# Patient Record
Sex: Male | Born: 1975 | Race: Black or African American | Hispanic: No | Marital: Single | Smoking: Never smoker
Health system: Southern US, Community
[De-identification: ages and names within clinical notes are randomized; demographics above are authoritative.]

## PROBLEM LIST (undated history)

## (undated) DIAGNOSIS — I10 Essential (primary) hypertension: Secondary | ICD-10-CM

## (undated) HISTORY — PX: KNEE ARTHROSCOPY: SUR90

---

## 2014-06-10 ENCOUNTER — Observation Stay (HOSPITAL_COMMUNITY)
Admission: AD | Admit: 2014-06-10 | Discharge: 2014-06-11 | Disposition: A | Discharge status: Home / Self Care | Payer: BC Managed Care – PPO | Source: Other Acute Inpatient Hospital | Attending: Cardiology | Admitting: Cardiology

## 2014-06-10 ENCOUNTER — Encounter (HOSPITAL_COMMUNITY): Payer: Self-pay | Admitting: Cardiology

## 2014-06-10 DIAGNOSIS — R072 Precordial pain: Principal | ICD-10-CM

## 2014-06-10 DIAGNOSIS — R079 Chest pain, unspecified: Secondary | ICD-10-CM | POA: Diagnosis present

## 2014-06-10 DIAGNOSIS — I10 Essential (primary) hypertension: Secondary | ICD-10-CM | POA: Diagnosis present

## 2014-06-10 HISTORY — DX: Essential (primary) hypertension: I10

## 2014-06-10 LAB — COMPREHENSIVE METABOLIC PANEL
ALBUMIN: 3.6 g/dL (ref 3.5–5.2)
ALT: 29 U/L (ref 0–53)
AST: 20 U/L (ref 0–37)
Alkaline Phosphatase: 59 U/L (ref 39–117)
Anion gap: 10 (ref 5–15)
BUN: 10 mg/dL (ref 6–23)
CALCIUM: 9.4 mg/dL (ref 8.4–10.5)
CO2: 33 mEq/L — ABNORMAL HIGH (ref 19–32)
Chloride: 101 mEq/L (ref 96–112)
Creatinine, Ser: 0.76 mg/dL (ref 0.50–1.35)
GFR calc Af Amer: >90 mL/min (ref >90)
GFR calc non Af Amer: >90 mL/min (ref >90)
Glucose, Bld: 82 mg/dL (ref 70–99)
Potassium: 3.2 mEq/L — ABNORMAL LOW (ref 3.7–5.3)
SODIUM: 144 meq/L (ref 137–147)
TOTAL PROTEIN: 7.3 g/dL (ref 6.0–8.3)
Total Bilirubin: 1.8 mg/dL — ABNORMAL HIGH (ref 0.3–1.2)

## 2014-06-10 LAB — PROTIME-INR
INR: 1.22 (ref 0.00–1.49)
Prothrombin Time: 15.4 seconds — ABNORMAL HIGH (ref 11.6–15.2)

## 2014-06-10 LAB — CBC WITH DIFFERENTIAL/PLATELET
BASOS ABS: 0 10*3/uL (ref 0.0–0.1)
Basophils Relative: 0 % (ref 0–1)
EOS PCT: 1 % (ref 0–5)
Eosinophils Absolute: 0.1 10*3/uL (ref 0.0–0.7)
HEMATOCRIT: 44.5 % (ref 39.0–52.0)
Hemoglobin: 15.6 g/dL (ref 13.0–17.0)
LYMPHS PCT: 25 % (ref 12–46)
Lymphs Abs: 1.9 10*3/uL (ref 0.7–4.0)
MCH: 30.6 pg (ref 26.0–34.0)
MCHC: 35.1 g/dL (ref 30.0–36.0)
MCV: 87.3 fL (ref 78.0–100.0)
Monocytes Absolute: 0.7 10*3/uL (ref 0.1–1.0)
Monocytes Relative: 10 % (ref 3–12)
Neutro Abs: 4.8 10*3/uL (ref 1.7–7.7)
Neutrophils Relative %: 64 % (ref 43–77)
PLATELETS: 258 10*3/uL (ref 150–400)
RBC: 5.1 MIL/uL (ref 4.22–5.81)
RDW: 12.2 % (ref 11.5–15.5)
WBC: 7.5 10*3/uL (ref 4.0–10.5)

## 2014-06-10 LAB — APTT: aPTT: 30 seconds (ref 24–37)

## 2014-06-10 LAB — MAGNESIUM: Magnesium: 1.8 mg/dL (ref 1.5–2.5)

## 2014-06-10 LAB — TROPONIN I: Troponin I: <0.3 ng/mL (ref <0.30)

## 2014-06-10 LAB — TSH: TSH: 0.983 u[IU]/mL (ref 0.350–4.500)

## 2014-06-10 MED ORDER — MORPHINE SULFATE 2 MG/ML IJ SOLN: 2.0000 mg | INTRAMUSCULAR | Status: DC | PRN

## 2014-06-10 MED ORDER — ASPIRIN 81 MG PO CHEW: 324.0000 mg | CHEWABLE_TABLET | ORAL | Status: AC

## 2014-06-10 MED ORDER — ONDANSETRON HCL 4 MG/2ML IJ SOLN: 4.0000 mg | Freq: Four times a day (QID) | INTRAMUSCULAR | Status: DC | PRN

## 2014-06-10 MED ORDER — NITROGLYCERIN 0.4 MG SL SUBL: 0.4000 mg | SUBLINGUAL_TABLET | SUBLINGUAL | Status: DC | PRN

## 2014-06-10 MED ORDER — ACETAMINOPHEN 325 MG PO TABS: 650.0000 mg | ORAL_TABLET | ORAL | Status: DC | PRN

## 2014-06-10 MED ORDER — HYDROCHLOROTHIAZIDE 25 MG PO TABS
25.0000 mg | ORAL_TABLET | Freq: Every day | ORAL | Status: DC
  Administered 2014-06-10 – 2014-06-11 (×2): 25 mg via ORAL
  Filled 2014-06-10 (×2): qty 1

## 2014-06-10 MED ORDER — ASPIRIN 300 MG RE SUPP: 300.0000 mg | RECTAL | Status: AC

## 2014-06-10 MED ORDER — METOPROLOL TARTRATE 25 MG PO TABS
25.0000 mg | ORAL_TABLET | Freq: Two times a day (BID) | ORAL | Status: DC
  Administered 2014-06-10 (×2): 25 mg via ORAL
  Filled 2014-06-10 (×4): qty 1

## 2014-06-10 MED ORDER — HEPARIN SODIUM (PORCINE) 5000 UNIT/ML IJ SOLN
5000.0000 [IU] | Freq: Three times a day (TID) | INTRAMUSCULAR | Status: DC
  Administered 2014-06-10 – 2014-06-11 (×3): 5000 [IU] via SUBCUTANEOUS
  Filled 2014-06-10 (×4): qty 1

## 2014-06-10 MED ORDER — IBUPROFEN 600 MG PO TABS
600.0000 mg | ORAL_TABLET | Freq: Three times a day (TID) | ORAL | Status: DC
  Administered 2014-06-10 – 2014-06-11 (×4): 600 mg via ORAL
  Filled 2014-06-10 (×6): qty 1

## 2014-06-10 MED ORDER — MORPHINE SULFATE 2 MG/ML IJ SOLN
INTRAMUSCULAR | Status: AC
  Administered 2014-06-10: 2 mg via INTRAVENOUS
  Filled 2014-06-10: qty 1

## 2014-06-10 MED ORDER — KETOROLAC TROMETHAMINE 15 MG/ML IJ SOLN
15.0000 mg | Freq: Four times a day (QID) | INTRAMUSCULAR | Status: DC | PRN
  Administered 2014-06-10: 15 mg via INTRAVENOUS
  Filled 2014-06-10: qty 1

## 2014-06-10 MED ORDER — ASPIRIN EC 81 MG PO TBEC
81.0000 mg | DELAYED_RELEASE_TABLET | Freq: Every day | ORAL | Status: DC
  Administered 2014-06-11: 81 mg via ORAL
  Filled 2014-06-10: qty 1

## 2014-06-10 NOTE — H&P (Addendum)
Admit date: 06/10/2014 Referring Physician Dr. Sharee Pimple at [facility] ER Primary Cardiologist:  NONE Chief complaint/reason for admission: Chest pain  HPI: This is a 38yo AAM with a PMH of HTN but no history of cardiac disease who presented to [facility] this am with complaints of CP.  He was in his USOH until last night while driving.  He says it felt like a pulled muscle with pressure and tightness in his upper left chest with tingling in his left arm.  He had some diaphoresis earlier in the day but denied any nausea or SOB.  At its worst it was a 6/10.  He was given NTPaste with no improvement in his pain but Morphine helped.  Of note he has been under a lot of stress over the past 4 days and on Monday night started boxing.  His pain has been constant since last night.  He denies any recent fever, chills or URI symptoms.  His symptoms are worse with movement with deep inspiration and movement.  Prior to this episode he has no DOE or exertional CP.  At [facility] ER his BNP and troponin were normal.  BP was elevated.  His EKG showed NSR with diffuse PR depression and LVH.  He is now transferred to [facility] for further evaluation.    PMH:    Past Medical History  Diagnosis Date  . Hypertension     PSH:    Past Surgical History  Procedure Laterality Date  . Knee arthroscopy      ALLERGIES:   Shrimp  Prior to Admit Meds:   Prescriptions prior to admission  Medication Sig Dispense Refill  . hydrochlorothiazide (HYDRODIURIL) 12.5 MG tablet Take 12.5 mg by mouth daily.      Marland Kitchen ibuprofen (ADVIL,MOTRIN) 200 MG tablet Take 200 mg by mouth every 6 (six) hours as needed for mild pain or moderate pain.       Family HX:    Family History  Problem Relation Age of Onset  . Hypertension Mother   . CVA Father    Social HX:    History   Social History  . Marital Status: Unknown    Spouse Name: N/A    Number of Children: N/A  . Years of Education: N/A   Occupational History  . Not on file.    Social History Main Topics  . Smoking status: Never Smoker   . Smokeless tobacco: Not on file  . Alcohol Use: No  . Drug Use: No  . Sexual Activity: Not on file   Other Topics Concern  . Not on file   Social History Narrative  . No narrative on file     ROS:  All 11 ROS were addressed and are negative except what is stated in the HPI  PHYSICAL EXAM Filed Vitals:   06/10/14 1219  BP: 156/95  Pulse: 79  Temp:   Resp:    General: Well developed, well nourished, in no acute distress Head: Eyes PERRLA, No xanthomas.   Normal cephalic and atramatic  Lungs:   Clear bilaterally to auscultation and percussion. Heart:   HRRR S1 S2 Pulses are 2+ & equal.            No carotid bruit. No JVD.  No abdominal bruits. No femoral bruits. Abdomen: Bowel sounds are positive, abdomen soft and non-tender without masses  Extremities:   No clubbing, cyanosis or edema.  DP +1 Neuro: Alert and oriented X 3. Psych:  Good affect, responds appropriately  Labs:   No results found for this basename: WBC,  HGB,  HCT,  MCV,  PLT   No results found for this basename: NA, K, CL, CO2, BUN, CREATININE, CALCIUM, LABALBU, PROT, BILITOT, ALKPHOS, ALT, AST, GLUCOSE,  in the last 168 hours No results found for this basename: CKTOTAL,  CKMB,  CKMBINDEX,  TROPONINI   No results found for this basename: PTT   No results found for this basename: INR,  PROTIME     No results found for this basename: CHOL   No results found for this basename: HDL   No results found for this basename: LDLCALC   No results found for this basename: TRIG   No results found for this basename: CHOLHDL   No results found for this basename: LDLDIRECT      Radiology:  No results found.  EKG:  NSR with PR depression, LVH  ASSESSMENT:  1.  Atypical CP with negative cardiac enzymes x 1.  One of his EKGs at [facility] showed some PR depression and diffuse minimal ST elevation which could indicate acute pericarditis.  His pain  is somewhat positional and worse with inspiration, but it also his worse with movement of his upper left arm and worse with palpation raising the question of musculoskeletal etiology especially in the setting of recently taking up boxing earlier this week.  D-Dimer and chest xray were normal. 2.  HTN poorly controlled with evidence of end organ damage with LVH on EKG  PLAN:   1.  Admit to tele bed 2.  Cycle cardiac enzymes 3.  Check 2D echo to assess for LVH and LVF 4.  No IV Heparin unless enzymes become positive 5.  NPO after MN 6.  Stress echo in am if enzymes are negative 7.  Add Lopressor  BID for BP control   Quintella Reichert, MD  06/10/2014  12:26 PM

## 2014-06-11 DIAGNOSIS — R072 Precordial pain: Principal | ICD-10-CM

## 2014-06-11 LAB — BASIC METABOLIC PANEL
Anion gap: 14 (ref 5–15)
BUN: 18 mg/dL (ref 6–23)
CALCIUM: 9.4 mg/dL (ref 8.4–10.5)
CO2: 30 mEq/L (ref 19–32)
Chloride: 96 mEq/L (ref 96–112)
Creatinine, Ser: 0.99 mg/dL (ref 0.50–1.35)
GFR calc Af Amer: >90 mL/min (ref >90)
GFR calc non Af Amer: >90 mL/min (ref >90)
GLUCOSE: 111 mg/dL — AB (ref 70–99)
Potassium: 3.4 mEq/L — ABNORMAL LOW (ref 3.7–5.3)
Sodium: 140 mEq/L (ref 137–147)

## 2014-06-11 LAB — LIPID PANEL
CHOL/HDL RATIO: 4.1 ratio
CHOLESTEROL: 155 mg/dL (ref 0–200)
HDL: 38 mg/dL — ABNORMAL LOW (ref >39)
LDL CALC: 99 mg/dL (ref 0–99)
Triglycerides: 88 mg/dL (ref <150)
VLDL: 18 mg/dL (ref 0–40)

## 2014-06-11 LAB — TROPONIN I: Troponin I: <0.3 ng/mL (ref <0.30)

## 2014-06-11 MED ORDER — KETOROLAC TROMETHAMINE 30 MG/ML IJ SOLN
15.0000 mg | Freq: Four times a day (QID) | INTRAMUSCULAR | Status: DC | PRN
  Administered 2014-06-11: 15 mg via INTRAVENOUS
  Filled 2014-06-11: qty 1

## 2014-06-11 MED ORDER — POTASSIUM CHLORIDE CRYS ER 20 MEQ PO TBCR
20.0000 meq | EXTENDED_RELEASE_TABLET | Freq: Once | ORAL | Status: AC
  Administered 2014-06-11: 20 meq via ORAL
  Filled 2014-06-11: qty 1

## 2014-06-11 MED ORDER — AMLODIPINE BESYLATE 5 MG PO TABS
5.0000 mg | ORAL_TABLET | Freq: Every day | ORAL | Status: DC
  Administered 2014-06-11: 5 mg via ORAL
  Filled 2014-06-11: qty 1

## 2014-06-11 MED ORDER — ASPIRIN 81 MG PO TBEC: 81.0000 mg | DELAYED_RELEASE_TABLET | Freq: Every day | ORAL | Status: DC

## 2014-06-11 MED ORDER — AMLODIPINE BESYLATE 5 MG PO TABS: 5.0000 mg | ORAL_TABLET | Freq: Every day | ORAL | Status: DC

## 2014-06-11 MED ORDER — ACETAMINOPHEN 325 MG PO TABS: 650.0000 mg | ORAL_TABLET | ORAL | Status: DC | PRN

## 2014-06-11 NOTE — Progress Notes (Signed)
UR completed 

## 2014-06-11 NOTE — Progress Notes (Signed)
Echocardiogram Echocardiogram Stress Test has been performed.  Francisco Crawford 06/11/2014, 3:44 PM

## 2014-06-11 NOTE — Discharge Instructions (Addendum)
Chest Pain (Nonspecific) It is often hard to give a diagnosis for the cause of chest pain. There is always a chance that your pain could be related to something serious, such as a heart attack or a blood clot in the lungs. You need to follow up with your doctor. HOME CARE  If antibiotic medicine was given, take it as directed by your doctor. Finish the medicine even if you start to feel better.  For the next few days, avoid activities that bring on chest pain. Continue physical activities as told by your doctor.  Do not use any tobacco products. This includes cigarettes, chewing tobacco, and e-cigarettes.  Avoid drinking alcohol.  Only take medicine as told by your doctor.  Follow your doctor's suggestions for more testing if your chest pain does not go away.  Keep all doctor visits you made. GET HELP IF:  Your chest pain does not go away, even after treatment.  You have a rash with blisters on your chest.  You have a fever. GET HELP RIGHT AWAY IF:   You have more pain or pain that spreads to your arm, neck, jaw, back, or belly (abdomen).  You have shortness of breath.  You cough more than usual or cough up blood.  You have very bad back or belly pain.  You feel sick to your stomach (nauseous) or throw up (vomit).  You have very bad weakness.  You pass out (faint).  You have chills. This is an emergency. Do not wait to see if the problems will go away. Call your local emergency services (911 in [place] Do not drive yourself to the hospital. MAKE SURE YOU:   Understand these instructions.  Will watch your condition.  Will get help right away if you are not doing well or get worse. Document Released: 03/20/2008 Document Revised: 10/07/2013 Document Reviewed: 03/20/2008 [facility] Patient Information 2015 [place], [place]. This information is not intended to replace advice given to you by your health care provider. Make sure you discuss any questions you have with your  health care provider. Hypertension Hypertension is another name for high blood pressure. High blood pressure forces your heart to work harder to pump blood. A blood pressure reading has two numbers, which includes a higher number over a lower number (example: 110/72). HOME CARE   Have your blood pressure rechecked by your doctor.  Only take medicine as told by your doctor. Follow the directions carefully. The medicine does not work as well if you skip doses. Skipping doses also puts you at risk for problems.  Do not smoke.  Monitor your blood pressure at home as told by your doctor. GET HELP IF:  You think you are having a reaction to the medicine you are taking.  You have repeat headaches or feel dizzy.  You have puffiness (swelling) in your ankles.  You have trouble with your vision. GET HELP RIGHT AWAY IF:   You get a very bad headache and are confused.  You feel weak, numb, or faint.  You get chest or belly (abdominal) pain.  You throw up (vomit).  You cannot breathe very well. MAKE SURE YOU:   Understand these instructions.  Will watch your condition.  Will get help right away if you are not doing well or get worse. Document Released: 03/20/2008 Document Revised: 10/07/2013 Document Reviewed: 07/25/2013 [facility] Patient Information 2015 [place], [place]. This information is not intended to replace advice given to you by your health care provider. Make sure you discuss  any questions you have with your health care provider.  Adenosine Stress Electrocardiography An adenosine stress electrocardiography is a test used to detect heart disease (coronary artery disease). Adenosine is a medicine that makes the heart arteries react as if you are exercising. Adenosine is given with a radioactive tracer. The "tracer" is a safe radioactive substance that travels in the bloodstream to the heart arteries. Special imaging cameras detect the tracer and help find blocked arteries in the  heart. This test may be done with or without treadmill exercise.  LET [facility] CARE PROVIDER KNOW ABOUT:  Allergies, including latex allergies.  All prescription medicines you taking as well as all non-prescription and over-the-counter medicines, including herbs and vitamins.  Use of steroids (by mouth or creams).  Previous problems with anesthetics or novocaine.  History of blood clots or bleeding problems.  Previous surgery.  Other health problems such as kidney or lung conditions.  Possibility of pregnancy, if this applies. RISKS AND COMPLICATIONS You may develop chest discomfort, shortness of breath, sweating, or light-headedness during the test. On rare occasions, you could experience a heart attack or your heart may go into a very fast or irregular rhythm. This could cause you to collapse. To ensure your safety, your health care provider will supervise the test. Your blood pressure and electrocardiogram are constantly watched. The test team watches for and is able to treat any problems. BEFORE THE PROCEDURE  Do not eat or drink caffeine for 12 to 24 hours before the test. This includes all caffeinated beverages and food, such as pop, coffee (roasted, instant, decaffeinated roasted, decaffeinated instant), hot chocolate, tea, and all chocolate.  Do not smoke on the day of your test. Smoking on the day of your test may change your test results.  Do not eat anything 3 hours before the test or as recommended by your health care provider. Eating may cause an unclear image and may also cause nausea. If you are diabetic, talk to your health care provider regarding your insulin coverage.  Bring a list of all the medicines you are taking. Take your medicine as usual before the test except as told by the testing center.  Wear comfortable clothing, such as a short-sleeve shirt and sweatpants. Do not wear an underwire bra or jewelry. A hospital gown can be provided.  Shower before your  appointment to reduce the spread of bacteria.  You may want to bring a book to read because there are some waiting periods during the test.  Your health care provider will go over the adenosine stress test with you, such as procedure protocol, what to expect, how long it will take, and results. PROCEDURE   An IV will be started in a vein in your hand or arm.  Electrode patches will be placed on your chest. The electrodes are connected to a monitor so your heart rhythm and heart rate can be watched. Your blood pressure will also be monitored during the test.  Two sets of images are usually taken of your heart. The images compare your heart at rest and when it is "stressed." This first image is a "resting" picture of your heart. The "resting" image is usually done before adenosine is given.  Adenosine is given in the IV over a period of 4 to 6 minutes.  After the adenosine is given, you will be monitored for a few minutes afterward to ensure your heart rate, heart rhythm, and blood pressure are normal. AFTER THE PROCEDURE  When your  test is completed, you may be asked to schedule an office visit with your health care provider to discuss the test results, or your health care provider may choose to call you with the results.  Document Released: 12/10/2006 Document Revised: 02/16/2014 Document Reviewed: 01/17/2012 [facility] Patient Information 2015 [place], [place]. This information is not intended to replace advice given to you by your health care provider. Make sure you discuss any questions you have with your health care provider.  Electrocardiography Electrocardiography is a test to check the heart. It looks at how your heart beats. It is done if:   You are having a heart attack.  You may have had a heart attack in the past.  You are having a health checkup. PROCEDURE  This test is easy and painless.  You will remove your clothes from the waist up, wear a hospital gown, and lie down on  your back.  Small round pads (electrodes) will be placed on your chest, arms, and legs. The round pads are attached to wires that go to a machine. The machine records the electrical activity of your heart.  You will be asked to relax and lie very still. AFTER THE PROCEDURE  If the test was done as part of a routine exam, you can go back to your normal activity as told by your doctor.  Your results will be looked at by a heart doctor (cardiologist).  Ask when your test results will be ready. Make sure you get your test results. Document Released: 09/14/2008 Document Revised: 02/16/2014 Document Reviewed: 02/12/2012 [facility] Patient Information 2015 [place], [place]. This information is not intended to replace advice given to you by your health care provider. Make sure you discuss any questions you have with your health care provider.

## 2014-06-11 NOTE — Progress Notes (Signed)
CXR report from [facility] 06/10/14-   " The heart size and mediastinal countors are within normal limits. Both lungs are clear. The visualized skeletal structures are unremarkable".   Impression: No active disease  Dr Burman Nieves MD   Corine Shelter PA-C 06/11/2014 11:30 AM

## 2014-06-11 NOTE — Progress Notes (Signed)
SUBJECTIVE:  Still with soreness that is most likely musculoskeletal.     PHYSICAL EXAM Filed Vitals:   06/10/14 2049 06/10/14 2100 06/10/14 2127 06/11/14 0500  BP: 142/104 126/53 144/107 140/96  Pulse: 75 61  68  Temp: 97.9 F (36.6 C) 97.7 F (36.5 C)  98.1 F (36.7 C)  TempSrc: Oral Oral  Oral  Resp: SpO2: 100% 97%  99%   General:  No distress Lungs:  Clear Heart:  RRR Abdomen:  Positive bowel sounds, no rebound no guarding Extremities:  No edema   LABS: Lab Results  Component Value Date   TROPONINI <0.30 06/11/2014   Results for orders placed during the hospital encounter of 06/10/14 (from the past 24 hour(s))  COMPREHENSIVE METABOLIC PANEL     Status: Abnormal   Collection Time    06/10/14  1:33 PM      Result Value Ref Range   Sodium 144  137 - 147 mEq/L   Potassium 3.2 (*) 3.7 - 5.3 mEq/L   Chloride 101  96 - 112 mEq/L   CO2 33 (*) 19 - 32 mEq/L   Glucose, Bld 82  70 - 99 mg/dL   BUN 10  6 - 23 mg/dL   Creatinine, Ser 6.57  0.50 - 1.35 mg/dL   Calcium 9.4  8.4 - 84.6 mg/dL   Total Protein 7.3  6.0 - 8.3 g/dL   Albumin 3.6  3.5 - 5.2 g/dL   AST 20  0 - 37 U/L   ALT 29  0 - 53 U/L   Alkaline Phosphatase 59  39 - 117 U/L   Total Bilirubin 1.8 (*) 0.3 - 1.2 mg/dL   GFR calc non Af Amer >90  >90 mL/min   GFR calc Af Amer >90  >90 mL/min   Anion gap 10  5 - 15  MAGNESIUM     Status: None   Collection Time    06/10/14  1:33 PM      Result Value Ref Range   Magnesium 1.8  1.5 - 2.5 mg/dL  TSH     Status: None   Collection Time    06/10/14  1:33 PM      Result Value Ref Range   TSH 0.983  0.350 - 4.500 uIU/mL  TROPONIN I     Status: None   Collection Time    06/10/14  1:33 PM      Result Value Ref Range   Troponin I <0.30  <0.30 ng/mL  CBC WITH DIFFERENTIAL     Status: None   Collection Time    06/10/14  1:33 PM      Result Value Ref Range   WBC 7.5  4.0 - 10.5 K/uL   RBC 5.10  4.22 - 5.81 MIL/uL   Hemoglobin 15.6  13.0 - 17.0 g/dL     HCT 96.2  95.2 - 84.1 %   MCV 87.3  78.0 - 100.0 fL   MCH 30.6  26.0 - 34.0 pg   MCHC 35.1  30.0 - 36.0 g/dL   RDW 32.4  40.1 - 02.7 %   Platelets 258  150 - 400 K/uL   Neutrophils Relative % 64  43 - 77 %   Neutro Abs 4.8  1.7 - 7.7 K/uL   Lymphocytes Relative 25  12 - 46 %   Lymphs Abs 1.9  0.7 - 4.0 K/uL   Monocytes Relative 10  3 - 12 %   Monocytes  Absolute 0.7  0.1 - 1.0 K/uL   Eosinophils Relative 1  0 - 5 %   Eosinophils Absolute 0.1  0.0 - 0.7 K/uL   Basophils Relative 0  0 - 1 %   Basophils Absolute 0.0  0.0 - 0.1 K/uL  PROTIME-INR     Status: Abnormal   Collection Time    06/10/14  1:33 PM      Result Value Ref Range   Prothrombin Time 15.4 (*) 11.6 - 15.2 seconds   INR 1.22  0.00 - 1.49  APTT     Status: None   Collection Time    06/10/14  1:33 PM      Result Value Ref Range   aPTT 30  24 - 37 seconds  TROPONIN I     Status: None   Collection Time    06/10/14  6:50 PM      Result Value Ref Range   Troponin I <0.30  <0.30 ng/mL  TROPONIN I     Status: None   Collection Time    06/11/14 12:37 AM      Result Value Ref Range   Troponin I <0.30  <0.30 ng/mL  BASIC METABOLIC PANEL     Status: Abnormal   Collection Time    06/11/14 12:37 AM      Result Value Ref Range   Sodium 140  137 - 147 mEq/L   Potassium 3.4 (*) 3.7 - 5.3 mEq/L   Chloride 96  96 - 112 mEq/L   CO2 30  19 - 32 mEq/L   Glucose, Bld 111 (*) 70 - 99 mg/dL   BUN 18  6 - 23 mg/dL   Creatinine, Ser 1.61  0.50 - 1.35 mg/dL   Calcium 9.4  8.4 - 09.6 mg/dL   GFR calc non Af Amer >90  >90 mL/min   GFR calc Af Amer >90  >90 mL/min   Anion gap 14  5 - 15  LIPID PANEL     Status: Abnormal   Collection Time    06/11/14 12:37 AM      Result Value Ref Range   Cholesterol 155  0 - 200 mg/dL   Triglycerides 88  <045 mg/dL   HDL 38 (*) >40 mg/dL   Total CHOL/HDL Ratio 4.1     VLDL 18  0 - 40 mg/dL   LDL Cholesterol 99  0 - 99 mg/dL    Intake/Output Summary (Last 24 hours) at 06/11/14 0720 Last  data filed at 06/10/14 1800  Gross per 24 hour  Intake    360 ml  Output    225 ml  Net    135 ml    EKG:   Sinus rhythm, rate 78, axis within normal limits, intervals within normal limits, no acute ST-T wave changes.  Early repol.  06/11/2014   ASSESSMENT AND PLAN:  Chest pain:  No objective evidence of ischemia.  Plan stress test today.  Home if negative.  HTN:  OK to continue beta blocker    Rollene Rotunda 06/11/2014 7:20 AM

## 2014-06-11 NOTE — Discharge Summary (Signed)
Patient ID: Francisco Crawford,  MRN: [id number], DOB/AGE: [birth year] 38 y.o.  Admit date: 06/10/2014 Discharge date: 06/11/2014  Primary Care Provider: Estanislado Pandy, MD Primary Cardiologist: Joycelyn Das pt  Discharge Diagnoses Principal Problem:   Chest pain Active Problems:   Hypertension    Procedures: Stress echo 06/11/14   Hospital Course: 38yo AAM with a PMH of HTN but no history of cardiac disease who presented to [facility] 06/10/14 with complaints of CP. At [facility] his Troponin was negative but his EKG was abnormal with LVH. He was transferred to [facility] and ruled out for an MI. He underwent a stress echo 06/11/14 which was negative though he did have elevated B/P. Norvasc was added and he has been instructed to call the [facility] office for follow up. We feel he can be discharged late on the 27th.    Discharge Vitals:  Blood pressure 140/96, pulse 68, temperature 98.1 F (36.7 C), temperature source Oral, resp. rate 18, height  (1.753 m), weight 185 lb 9.6 oz (84.188 kg), SpO2 99.00%.    Labs: Results for orders placed during the hospital encounter of 06/10/14 (from the past 24 hour(s))  TROPONIN I     Status: None   Collection Time    06/10/14  6:50 PM      Result Value Ref Range   Troponin I <0.30  <0.30 ng/mL  TROPONIN I     Status: None   Collection Time    06/11/14 12:37 AM      Result Value Ref Range   Troponin I <0.30  <0.30 ng/mL  BASIC METABOLIC PANEL     Status: Abnormal   Collection Time    06/11/14 12:37 AM      Result Value Ref Range   Sodium 140  137 - 147 mEq/L   Potassium 3.4 (*) 3.7 - 5.3 mEq/L   Chloride 96  96 - 112 mEq/L   CO2 30  19 - 32 mEq/L   Glucose, Bld 111 (*) 70 - 99 mg/dL   BUN 18  6 - 23 mg/dL   Creatinine, Ser 5.28  0.50 - 1.35 mg/dL   Calcium 9.4  8.4 - 41.3 mg/dL   GFR calc non Af Amer >90  >90 mL/min   GFR calc Af Amer >90  >90 mL/min   Anion gap 14  5 - 15  LIPID PANEL     Status: Abnormal   Collection Time   06/11/14 12:37 AM      Result Value Ref Range   Cholesterol 155  0 - 200 mg/dL   Triglycerides 88  <244 mg/dL   HDL 38 (*) >01 mg/dL   Total CHOL/HDL Ratio 4.1     VLDL 18  0 - 40 mg/dL   LDL Cholesterol 99  0 - 99 mg/dL    Disposition:      Follow-up Information   Call Antoine Poche, MD. (Call our office in [place] for follow up)    Specialty:  Cardiology   Contact information:   [address] [place] [address] [phone]       Discharge Medications:    Medication List         acetaminophen 325 MG tablet  Commonly known as:  TYLENOL  Take 2 tablets (650 mg total) by mouth every 4 (four) hours as needed for headache or mild pain.     amLODipine 5 MG tablet  Commonly known as:  NORVASC  Take  1 tablet (5 mg total) by mouth daily.     aspirin 81 MG EC tablet  Take 1 tablet (81 mg total) by mouth daily.     hydrochlorothiazide 12.5 MG tablet  Commonly known as:  HYDRODIURIL  Take 12.5 mg by mouth daily.     ibuprofen 200 MG tablet  Commonly known as:  ADVIL,MOTRIN  Take 200 mg by mouth every 6 (six) hours as needed for mild pain or moderate pain.         Duration of Discharge Encounter: Greater than 30 minutes including physician time.  Jolene Provost PA-C 06/11/2014 4:58 PM  Patient seen and examined.  Plan as discussed in my rounding note for today and outlined above. Rollene Rotunda  06/11/2014  5:25 PM

## 2014-06-11 NOTE — Progress Notes (Signed)
     The patient was seen in nuclear medicine for an exercise ECHO stress test. He tolerated the procedure well. No acute ST or TW changes on ECG. Test terminated early due to high BP ( 249/120) at target HR of 153. Will await images. No sx.  Thereasa Parkin PA-C  [facility]

## 2014-06-29 ENCOUNTER — Ambulatory Visit (INDEPENDENT_AMBULATORY_CARE_PROVIDER_SITE_OTHER): Payer: BC Managed Care – PPO | Admitting: Cardiology

## 2014-06-29 ENCOUNTER — Encounter: Payer: Self-pay | Admitting: Cardiology

## 2014-06-29 VITALS — BP 131/91 | HR 87 | Ht 69.0 in | Wt 182.0 lb

## 2014-06-29 DIAGNOSIS — I10 Essential (primary) hypertension: Secondary | ICD-10-CM

## 2014-06-29 DIAGNOSIS — R0789 Other chest pain: Secondary | ICD-10-CM

## 2014-06-29 NOTE — Patient Instructions (Signed)
   Stop Aspirin. Continue all other medications.   DASH Diet info given. Your physician wants you to follow up in:  1 year.  You will receive a reminder letter in the mail one-two months in advance.  If you don't receive a letter, please call our office to schedule the follow up appointment.

## 2014-06-29 NOTE — Progress Notes (Signed)
Clinical Summary Francisco Crawford is a 38 y.o.male seen today for hospital follow up of the following medical problems.  1. Chest pain - recent admit 05/2014 with chest pain at [facility], transferred to [facility] due to abnormal EKG wit LVH and early repoloraization pattern - cardiac enzymes negative. Stress echo 06/11/14 without ischemia - denies any recent chest pain. Denies any SOB or DOE, walks regularly without any specific limitation.   2. HTN - does not check at home regularly - compliant with meds, started on norvasc at discharge.   Past Medical History  Diagnosis Date  . Hypertension      Allergies  Allergen Reactions  . Shrimp [Shellfish Allergy] Rash     Current Outpatient Prescriptions  Medication Sig Dispense Refill  . acetaminophen (TYLENOL) 325 MG tablet Take 2 tablets (650 mg total) by mouth every 4 (four) hours as needed for headache or mild pain.      Marland Kitchen amLODipine (NORVASC) 5 MG tablet Take 1 tablet (5 mg total) by mouth daily.  30 tablet  11  . aspirin EC 81 MG EC tablet Take 1 tablet (81 mg total) by mouth daily.      . hydrochlorothiazide (HYDRODIURIL) 12.5 MG tablet Take 12.5 mg by mouth daily.      Marland Kitchen ibuprofen (ADVIL,MOTRIN) 200 MG tablet Take 200 mg by mouth every 6 (six) hours as needed for mild pain or moderate pain.       No current facility-administered medications for this visit.     Past Surgical History  Procedure Laterality Date  . Knee arthroscopy Right X 2     Allergies  Allergen Reactions  . Shrimp [Shellfish Allergy] Rash      Family History  Problem Relation Age of Onset  . Hypertension Mother   . CVA Father      Social History Francisco Crawford reports that he has never smoked. He has never used smokeless tobacco. Francisco Crawford reports that he does not drink alcohol.   Review of Systems CONSTITUTIONAL: No weight loss, fever, chills, weakness or fatigue.  HEENT: Eyes: No visual loss, blurred vision, double vision or yellow  sclerae.No hearing loss, sneezing, congestion, runny nose or sore throat.  SKIN: No rash or itching.  CARDIOVASCULAR: per HPI RESPIRATORY: No shortness of breath, cough or sputum.  GASTROINTESTINAL: No anorexia, nausea, vomiting or diarrhea. No abdominal pain or blood.  GENITOURINARY: No burning on urination, no polyuria NEUROLOGICAL: No headache, dizziness, syncope, paralysis, ataxia, numbness or tingling in the extremities. No change in bowel or bladder control.  MUSCULOSKELETAL: No muscle, back pain, joint pain or stiffness.  LYMPHATICS: No enlarged nodes. No history of splenectomy.  PSYCHIATRIC: No history of depression or anxiety.  ENDOCRINOLOGIC: No reports of sweating, cold or heat intolerance. No polyuria or polydipsia.  Marland Kitchen   Physical Examination p 87 bp 131/91 Wt 182 lbs BMI 27 Gen: resting comfortably, no acute distress HEENT: no scleral icterus, pupils equal round and reactive, no palptable cervical adenopathy,  CV: RRR, no m/r/g, no JVD, no carotid bruits Resp: Clear to auscultation bilaterally GI: abdomen is soft, non-tender, non-distended, normal bowel sounds, no hepatosplenomegaly MSK: extremities are warm, no edema.  Skin: warm, no rash Neuro:  no focal deficits Psych: appropriate affect   Diagnostic Studies Stress results: Maximal heart rate during stress was 153 bpm (85% of maximal predicted heart rate). The maximal predicted heart rate was 181 bpm. There was a hypertensive response to stress. The rate-pressure product for the peak  heart rate and blood pressure was 16109 mm Hg/min. Mild stress-induced .  ------------------------------------------------------------------- Stress ECG: The stress ECG was normal.  ------------------------------------------------------------------- Baseline: Normal wall motion; no LV regional wall motion abnormalities.  Immediate post stress:  - LV global systolic function was vigorous. - No evidence for new LV regional wall  motion abnormalities.      Assessment and Plan   1. Chest pain - recent admit with no evidence of ACS, EKG with LVH changes. Negative stress echo - no recurrence of symptoms, continue risk factor modification - stop ASA  2. HTN - at goal, continue current meds - educated on DASH diet and provided information   F/u 1 year  Antoine Poche, M.D., F.A.C.C.

## 2016-02-04 DIAGNOSIS — R197 Diarrhea, unspecified: Secondary | ICD-10-CM | POA: Diagnosis not present

## 2016-02-04 DIAGNOSIS — Z6827 Body mass index (BMI) 27.0-27.9, adult: Secondary | ICD-10-CM | POA: Diagnosis not present

## 2016-03-06 ENCOUNTER — Encounter: Payer: Self-pay | Admitting: Cardiology

## 2016-03-06 ENCOUNTER — Ambulatory Visit (INDEPENDENT_AMBULATORY_CARE_PROVIDER_SITE_OTHER): Payer: BLUE CROSS/BLUE SHIELD | Admitting: Cardiology

## 2016-03-06 ENCOUNTER — Encounter: Payer: Self-pay | Admitting: *Deleted

## 2016-03-06 VITALS — BP 118/79 | HR 67 | Ht 69.0 in | Wt 190.0 lb

## 2016-03-06 DIAGNOSIS — R0789 Other chest pain: Secondary | ICD-10-CM | POA: Diagnosis not present

## 2016-03-06 DIAGNOSIS — I10 Essential (primary) hypertension: Secondary | ICD-10-CM | POA: Diagnosis not present

## 2016-03-06 NOTE — Progress Notes (Addendum)
Patient ID: Francisco Crawford, male   DOB: [birth year], 40 y.o.   MRN: [id number]     Clinical Summary [name] is a 40 y.o.male seen today for hospital follow up of the following medical problems.  1. Chest pain - [facility], [facility] due to abnormal EKG with LVH and early repoloraization pattern - cardiac enzymes negative. Stress echo 06/11/14 without ischemia  -no recent chest pain since last visit. No SOB or DOE.    2. HTN - does not check at home regularly - compliant with meds   SH: works as Investment banker, corporatemusic engineer.    Past Medical History  Diagnosis Date  . Hypertension      No Active Allergies   Current Outpatient Prescriptions  Medication Sig Dispense Refill  . acetaminophen (TYLENOL) 325 MG tablet Take 2 tablets (650 mg total) by mouth every 4 (four) hours as needed for headache or mild pain.    Marland Kitchen. amLODipine (NORVASC) 5 MG tablet Take 1 tablet (5 mg total) by mouth daily. 30 tablet 11  . hydrochlorothiazide (HYDRODIURIL) 12.5 MG tablet Take 12.5 mg by mouth daily.    Marland Kitchen. ibuprofen (ADVIL,MOTRIN) 200 MG tablet Take 200 mg by mouth every 6 (six) hours as needed for mild pain or moderate pain.     No current facility-administered medications for this visit.     Past Surgical History  Procedure Laterality Date  . Knee arthroscopy Right X 2     No Active Allergies    Family History  Problem Relation Age of Onset  . Hypertension Mother   . CVA Father      Social History [name] reports that he has never smoked. He has never used smokeless tobacco. [name] reports that he does not drink alcohol.   Review of Systems CONSTITUTIONAL: No weight loss, fever, chills, weakness or fatigue.  HEENT: Eyes: No visual loss, blurred vision, double vision or yellow sclerae.No hearing loss, sneezing, congestion, runny nose or sore throat.  SKIN: No rash or itching.  CARDIOVASCULAR: per HPI RESPIRATORY: No shortness of  breath, cough or sputum.  GASTROINTESTINAL: No anorexia, nausea, vomiting or diarrhea. No abdominal pain or blood.  GENITOURINARY: No burning on urination, no polyuria NEUROLOGICAL: No headache, dizziness, syncope, paralysis, ataxia, numbness or tingling in the extremities. No change in bowel or bladder control.  MUSCULOSKELETAL: No muscle, back pain, joint pain or stiffness.  LYMPHATICS: No enlarged nodes. No history of splenectomy.  PSYCHIATRIC: No history of depression or anxiety.  ENDOCRINOLOGIC: No reports of sweating, cold or heat intolerance. No polyuria or polydipsia.  Marland Kitchen.   Physical Examination Filed Vitals:   03/06/16 1059  BP: 118/79  Pulse: 67   Filed Vitals:   03/06/16 1059  Height: 5\' 9"  (1.753 m)  Weight: 190 lb (86.183 kg)    Gen: resting comfortably, no acute distress HEENT: no scleral icterus, pupils equal round and reactive, no palptable cervical adenopathy,  CV: RRR, no m/r/g, no jvd Resp: Clear to auscultation bilaterally GI: abdomen is soft, non-tender, non-distended, normal bowel sounds, no hepatosplenomegaly MSK: extremities are warm, no edema.  Skin: warm, no rash Neuro:  no focal deficits Psych: appropriate affect   Diagnostic Studies  05/2014 Stress echo Stress results: Maximal heart rate during stress was 153 bpm (85% of maximal predicted heart rate). The maximal predicted heart rate was 181 bpm. There was a hypertensive response to stress. The rate-pressure product for the peak heart rate and blood pressure  was 38097 mm Hg/min. Mild stress-induced .  ------------------------------------------------------------------- Stress ECG: The stress ECG was normal.  ------------------------------------------------------------------- Baseline: Normal wall motion; no LV regional wall motion abnormalities.  Immediate post stress:  - LV global systolic function was vigorous. - No evidence for new LV regional wall motion abnormalities.   Assessment  and Plan   1. Chest pain - negative workup with stress echo a few years ago, no recurrent symptoms. EKG in clinic shows no ischemic changes - continue to monitor  2. HTN - at goal, continue current meds   F/u as needed. Request labs from pcp     Antoine Poche, M.D.

## 2016-03-06 NOTE — Patient Instructions (Signed)
Your physician recommends that you schedule a follow-up appointment AS NEEDED WITH DR. BRANCH  Your physician recommends that you continue on your current medications as directed. Please refer to the Current Medication list given to you today.  Thank you for choosing [facility]!!   

## 2016-04-17 DIAGNOSIS — Z6826 Body mass index (BMI) 26.0-26.9, adult: Secondary | ICD-10-CM | POA: Diagnosis not present

## 2016-04-17 DIAGNOSIS — M545 Low back pain: Secondary | ICD-10-CM | POA: Diagnosis not present

## 2016-04-17 DIAGNOSIS — K5901 Slow transit constipation: Secondary | ICD-10-CM | POA: Diagnosis not present

## 2016-05-03 DIAGNOSIS — I10 Essential (primary) hypertension: Secondary | ICD-10-CM | POA: Diagnosis not present

## 2016-05-03 DIAGNOSIS — K219 Gastro-esophageal reflux disease without esophagitis: Secondary | ICD-10-CM | POA: Diagnosis not present

## 2016-05-03 DIAGNOSIS — E7800 Pure hypercholesterolemia, unspecified: Secondary | ICD-10-CM | POA: Diagnosis not present

## 2016-05-03 DIAGNOSIS — E876 Hypokalemia: Secondary | ICD-10-CM | POA: Diagnosis not present

## 2016-05-05 DIAGNOSIS — K219 Gastro-esophageal reflux disease without esophagitis: Secondary | ICD-10-CM | POA: Diagnosis not present

## 2016-05-05 DIAGNOSIS — I10 Essential (primary) hypertension: Secondary | ICD-10-CM | POA: Diagnosis not present

## 2016-05-05 DIAGNOSIS — Z13220 Encounter for screening for lipoid disorders: Secondary | ICD-10-CM | POA: Diagnosis not present

## 2016-05-05 DIAGNOSIS — M1711 Unilateral primary osteoarthritis, right knee: Secondary | ICD-10-CM | POA: Diagnosis not present

## 2016-09-13 DIAGNOSIS — Z6827 Body mass index (BMI) 27.0-27.9, adult: Secondary | ICD-10-CM | POA: Diagnosis not present

## 2016-09-13 DIAGNOSIS — J189 Pneumonia, unspecified organism: Secondary | ICD-10-CM | POA: Diagnosis not present

## 2016-11-06 DIAGNOSIS — E7800 Pure hypercholesterolemia, unspecified: Secondary | ICD-10-CM | POA: Diagnosis not present

## 2016-11-06 DIAGNOSIS — I10 Essential (primary) hypertension: Secondary | ICD-10-CM | POA: Diagnosis not present

## 2016-11-16 DIAGNOSIS — Z6826 Body mass index (BMI) 26.0-26.9, adult: Secondary | ICD-10-CM | POA: Diagnosis not present

## 2016-11-16 DIAGNOSIS — K219 Gastro-esophageal reflux disease without esophagitis: Secondary | ICD-10-CM | POA: Diagnosis not present

## 2016-11-16 DIAGNOSIS — I10 Essential (primary) hypertension: Secondary | ICD-10-CM | POA: Diagnosis not present

## 2016-11-16 DIAGNOSIS — E7800 Pure hypercholesterolemia, unspecified: Secondary | ICD-10-CM | POA: Diagnosis not present

## 2017-02-20 ENCOUNTER — Emergency Department (HOSPITAL_COMMUNITY): Payer: BLUE CROSS/BLUE SHIELD

## 2017-02-20 ENCOUNTER — Emergency Department (HOSPITAL_COMMUNITY)
Admission: EM | Admit: 2017-02-20 | Discharge: 2017-02-20 | Disposition: A | Discharge status: Home / Self Care | Payer: BLUE CROSS/BLUE SHIELD | Attending: Emergency Medicine | Admitting: Emergency Medicine

## 2017-02-20 ENCOUNTER — Encounter (HOSPITAL_COMMUNITY): Payer: Self-pay

## 2017-02-20 DIAGNOSIS — M7989 Other specified soft tissue disorders: Secondary | ICD-10-CM | POA: Diagnosis not present

## 2017-02-20 DIAGNOSIS — R52 Pain, unspecified: Secondary | ICD-10-CM

## 2017-02-20 DIAGNOSIS — Z79899 Other long term (current) drug therapy: Secondary | ICD-10-CM | POA: Insufficient documentation

## 2017-02-20 DIAGNOSIS — I10 Essential (primary) hypertension: Secondary | ICD-10-CM | POA: Diagnosis not present

## 2017-02-20 DIAGNOSIS — L03116 Cellulitis of left lower limb: Secondary | ICD-10-CM | POA: Diagnosis not present

## 2017-02-20 DIAGNOSIS — L03115 Cellulitis of right lower limb: Secondary | ICD-10-CM | POA: Diagnosis not present

## 2017-02-20 DIAGNOSIS — R600 Localized edema: Secondary | ICD-10-CM | POA: Diagnosis not present

## 2017-02-20 LAB — CBC WITH DIFFERENTIAL/PLATELET
Basophils Absolute: 0 10*3/uL (ref 0.0–0.1)
Basophils Relative: 0 %
Eosinophils Absolute: 0 10*3/uL (ref 0.0–0.7)
Eosinophils Relative: 0 %
HEMATOCRIT: 43 % (ref 39.0–52.0)
HEMOGLOBIN: 15.3 g/dL (ref 13.0–17.0)
LYMPHS PCT: 12 %
Lymphs Abs: 1 10*3/uL (ref 0.7–4.0)
MCH: 30.3 pg (ref 26.0–34.0)
MCHC: 35.6 g/dL (ref 30.0–36.0)
MCV: 85.1 fL (ref 78.0–100.0)
MONO ABS: 1 10*3/uL (ref 0.1–1.0)
Monocytes Relative: 11 %
NEUTROS ABS: 6.8 10*3/uL (ref 1.7–7.7)
NEUTROS PCT: 77 %
Platelets: 249 10*3/uL (ref 150–400)
RBC: 5.05 MIL/uL (ref 4.22–5.81)
RDW: 12.6 % (ref 11.5–15.5)
WBC: 8.7 10*3/uL (ref 4.0–10.5)

## 2017-02-20 LAB — BASIC METABOLIC PANEL
ANION GAP: 10 (ref 5–15)
BUN: 11 mg/dL (ref 6–20)
CHLORIDE: 98 mmol/L — AB (ref 101–111)
CO2: 29 mmol/L (ref 22–32)
Calcium: 9 mg/dL (ref 8.9–10.3)
Creatinine, Ser: 0.91 mg/dL (ref 0.61–1.24)
GFR calc Af Amer: >60 mL/min (ref >60)
GFR calc non Af Amer: >60 mL/min (ref >60)
Glucose, Bld: 102 mg/dL — ABNORMAL HIGH (ref 65–99)
Potassium: 2.7 mmol/L — CL (ref 3.5–5.1)
Sodium: 137 mmol/L (ref 135–145)

## 2017-02-20 MED ORDER — POTASSIUM CHLORIDE CRYS ER 20 MEQ PO TBCR
40.0000 meq | EXTENDED_RELEASE_TABLET | Freq: Once | ORAL | Status: AC
  Administered 2017-02-20: 40 meq via ORAL
  Filled 2017-02-20: qty 2

## 2017-02-20 MED ORDER — ACETAMINOPHEN 500 MG PO TABS
1000.0000 mg | ORAL_TABLET | Freq: Once | ORAL | Status: AC
  Administered 2017-02-20: 1000 mg via ORAL
  Filled 2017-02-20: qty 2

## 2017-02-20 NOTE — Discharge Instructions (Signed)
Continue taking the antibiotics that the urgent care gave you.   Follow-up with your primary care doctor or return to this department in 2 days for re-evaluation of wound site.   You can alternate tylenol and ibuprofen for fever and pain relief. You can take 600 mg of ibuprofen, wait 4 hours and take 1000 mg of tylenol and alternate as needed.   Make sure you are staying hydrated.   Return to the Emergency Department sooner if you have worsening pain, redness/swelling that extends up the leg, persistent fever or any other worsening or concerning symptoms.

## 2017-02-20 NOTE — ED Triage Notes (Signed)
Patient reports that he noted redness of the left lower leg and at 0300 the left leg was hot to touch and increased swelling and redness. Patient went to an UC and was prescribed doxycycline. Patient states an oral temp of 102.5 at 1700 today. Patient vomited x 1 while in triage.

## 2017-02-20 NOTE — ED Provider Notes (Signed)
[facility]-EMERGENCY DEPT Provider Note   CSN: [id number] Arrival date & time: 02/20/17  1820     History   Chief Complaint Chief Complaint  Patient presents with  . Leg Swelling  . redness  . Fever    HPI Francisco Crawford is a 41 y.o. male progressively worsening left lower leg pain, swelling, and redness. Patient states that he first noticed some mild redness and swelling to the medial aspect of his right ankle last night around 11 PM. This morning, he began experiencing fever Tmax 102.5. He was evaluated by Urgent Care and started on Doxycycline for cellulitis. He has taken one dose of Doxyclyine. He notes that throughout the day, the redness and swelling began to increase, prompting ED visit. He has taken ibuprofen with mild relief of fever. He denies any trauma or injury to the leg. He denies any insect bite.   The history is provided by the patient.    Past Medical History:  Diagnosis Date  . Hypertension     Patient Active Problem List   Diagnosis Date Noted  . Chest pain 06/10/2014  . Hypertension     Past Surgical History:  Procedure Laterality Date  . KNEE ARTHROSCOPY Right X 2       Home Medications    Prior to Admission medications   Medication Sig Start Date End Date Taking? Authorizing Provider  amLODipine (NORVASC) 10 MG tablet Take 10 mg by mouth daily.    Yes [provider]  doxycycline (VIBRAMYCIN) 100 MG capsule Take 100 mg by mouth 2 (two) times daily. 02/20/17 03/02/17 Yes [provider]  hydrochlorothiazide (MICROZIDE) 12.5 MG capsule Take 12.5 mg by mouth daily.   Yes [provider]  ibuprofen (ADVIL,MOTRIN) 200 MG tablet Take 600 mg by mouth every 6 (six) hours as needed for headache, mild pain or moderate pain.    Yes [provider]    Family History Family History  Problem Relation Age of Onset  . Hypertension Mother   . CVA Father     Social History Social History  Substance Use Topics  . Smoking  status: Never Smoker  . Smokeless tobacco: Never Used  . Alcohol use No     Allergies   Patient has no known allergies.   Review of Systems Review of Systems  Constitutional: Positive for fever.  Respiratory: Negative for cough and shortness of breath.   Cardiovascular: Negative for chest pain.  Gastrointestinal: Negative for abdominal pain, diarrhea, nausea and vomiting.  Genitourinary: Negative for dysuria and hematuria.  Musculoskeletal:       +Right leg pain and swelling  Skin: Positive for color change.     Physical Exam Updated Vital Signs BP (!) 126/95 (BP Location: Right Arm)   Pulse 80   Temp 99.9 F (37.7 C) (Oral)   Resp 16   Ht 5\' 9"  (1.753 m)   Wt 86.2 kg   SpO2 96%   BMI 28.06 kg/m   Physical Exam  Constitutional: He appears well-developed and well-nourished.  Sitting comfortably in bed   HENT:  Head: Normocephalic and atraumatic.  Mouth/Throat: Uvula is midline, oropharynx is clear and moist and mucous membranes are normal.  Eyes: Conjunctivae and EOM are normal. Pupils are equal, round, and reactive to light. Right eye exhibits no discharge. Left eye exhibits no discharge. No scleral icterus.  Cardiovascular: Normal rate and regular rhythm.   Pulses:      Dorsalis pedis pulses are 2+ on the right side, and  2+ on the left side.  Pulmonary/Chest: Effort normal.  Musculoskeletal: He exhibits no deformity.  Tenderness to palpation to the right medial malleolus. Right lower extremity with diffuse soft tissue swelling to the medial aspect of the distal leg extending to the medial ankle. FROM of right ankle. No pitting edema. No deformity or crepitus.  Normal left ankle.   Neurological: He is alert.  Skin: Skin is warm and dry. There is erythema.  Erythema, warmth, and induration that begins at the medial aspect of the distal leg and extends distally to the ankle and foot. No open wounds, no lacerations. No evidence of lymphangitis.   Psychiatric: He has  a normal mood and affect. His speech is normal and behavior is normal.       ED Treatments / Results  Labs (all labs ordered are listed, but only abnormal results are displayed) Labs Reviewed  BASIC METABOLIC PANEL - Abnormal; Notable for the following:       Result Value   Potassium 2.7 (*)    Chloride 98 (*)    Glucose, Bld 102 (*)    All other components within normal limits  CBC WITH DIFFERENTIAL/PLATELET    EKG  EKG Interpretation None       Radiology Dg Ankle 2 Views Left  Result Date: 02/20/2017 CLINICAL DATA:  Left ankle pain and swelling for 1 day. No known injury. EXAM: LEFT ANKLE - 2 VIEW COMPARISON:  None. FINDINGS: There is no evidence of fracture, dislocation, or joint effusion. There is no evidence of arthropathy or other focal bone abnormality. Medial soft tissue edema. No soft tissue air or radiopaque foreign body. IMPRESSION: Medial soft tissue edema. No osseous abnormality, soft tissue air or radiopaque foreign body. Electronically Signed   By: Rubye OaksMelanie  Ehinger M.D.   On: 02/20/2017 21:57    Procedures Procedures (including critical care time)  Medications Ordered in ED Medications  potassium chloride SA (K-DUR,KLOR-CON) CR tablet 40 mEq (40 mEq Oral Given 02/20/17 2314)  acetaminophen (TYLENOL) tablet 1,000 mg (1,000 mg Oral Given 02/20/17 2314)     Initial Impression / Assessment and Plan / ED Course  I have reviewed the triage vital signs and the nursing notes.  Pertinent labs & imaging results that were available during my care of the patient were reviewed by me and considered in my medical decision making (see chart for details).     41 y.o. M with PMH/o HTN who presents with worsening RLE pain, swelling, warmth. Started on Doxycycline but has only had one dose. Physical exam shows warmth, induration, and erythema to the right lower extremity. Concern for cellulitis. History/physical exam are not concerning for a septic joint or DVT. Will obtain  basic labs to evaluate for acute infectious etiology. Given tenderness over medial malleolus and worsening symptoms, will obtain XR.   XR reviewed. Negative for any acute fracture, dislocation, foreign body or osseous abnormality. CBC with WBC within normal limits. Lab reported a critical potassium level of 2.7. Oral potassium replacement ordered in the ED. Discussed results with patient. He has a history of hypokalemia. Patient continues to remain afebrile in the department. Considering that patient is not a diabetic or immunocompromised and his labs are WNL, patient can continue outpatient antibiotic therapy for treatment of cellulitis. Given that he has only had one dose of Doxycycline, he has not failed outpatient abx therapy. Will have him continue abx as prescribed.   Discussed plan with patient. He is in agreement. He will follow-up with his  PCP or to the ED in 2 days for wound recheck. Instructed him to return further for any worsening symptoms. Strict return precautions discussed. Patient expresses understanding and agreement to plan.   Final Clinical Impressions(s) / ED Diagnoses   Final diagnoses:  Cellulitis of left lower extremity    New Prescriptions Discharge Medication List as of 02/20/2017 11:14 PM       Maxwell Caul, PA-C 02/21/17 0126    Nira Conn, MD 02/23/17 0006

## 2017-02-22 DIAGNOSIS — Z6827 Body mass index (BMI) 27.0-27.9, adult: Secondary | ICD-10-CM | POA: Diagnosis not present

## 2017-02-22 DIAGNOSIS — L03116 Cellulitis of left lower limb: Secondary | ICD-10-CM | POA: Diagnosis not present

## 2017-06-04 DIAGNOSIS — E7800 Pure hypercholesterolemia, unspecified: Secondary | ICD-10-CM | POA: Diagnosis not present

## 2017-06-04 DIAGNOSIS — E876 Hypokalemia: Secondary | ICD-10-CM | POA: Diagnosis not present

## 2017-06-04 DIAGNOSIS — I10 Essential (primary) hypertension: Secondary | ICD-10-CM | POA: Diagnosis not present

## 2017-06-04 DIAGNOSIS — K219 Gastro-esophageal reflux disease without esophagitis: Secondary | ICD-10-CM | POA: Diagnosis not present

## 2017-06-07 DIAGNOSIS — E7800 Pure hypercholesterolemia, unspecified: Secondary | ICD-10-CM | POA: Diagnosis not present

## 2017-06-07 DIAGNOSIS — Z6826 Body mass index (BMI) 26.0-26.9, adult: Secondary | ICD-10-CM | POA: Diagnosis not present

## 2017-06-07 DIAGNOSIS — Z1389 Encounter for screening for other disorder: Secondary | ICD-10-CM | POA: Diagnosis not present

## 2017-06-07 DIAGNOSIS — K219 Gastro-esophageal reflux disease without esophagitis: Secondary | ICD-10-CM | POA: Diagnosis not present

## 2017-06-07 DIAGNOSIS — I10 Essential (primary) hypertension: Secondary | ICD-10-CM | POA: Diagnosis not present

## 2017-09-20 IMAGING — CR DG ANKLE 2V *L*
2 series · 2 of 2 positions shown · non-contrast
Comparison: None.

CLINICAL DATA: Left ankle pain and swelling for 1 day. No known
injury.

EXAM:
LEFT ANKLE - 2 VIEW

[x ankle ap left]
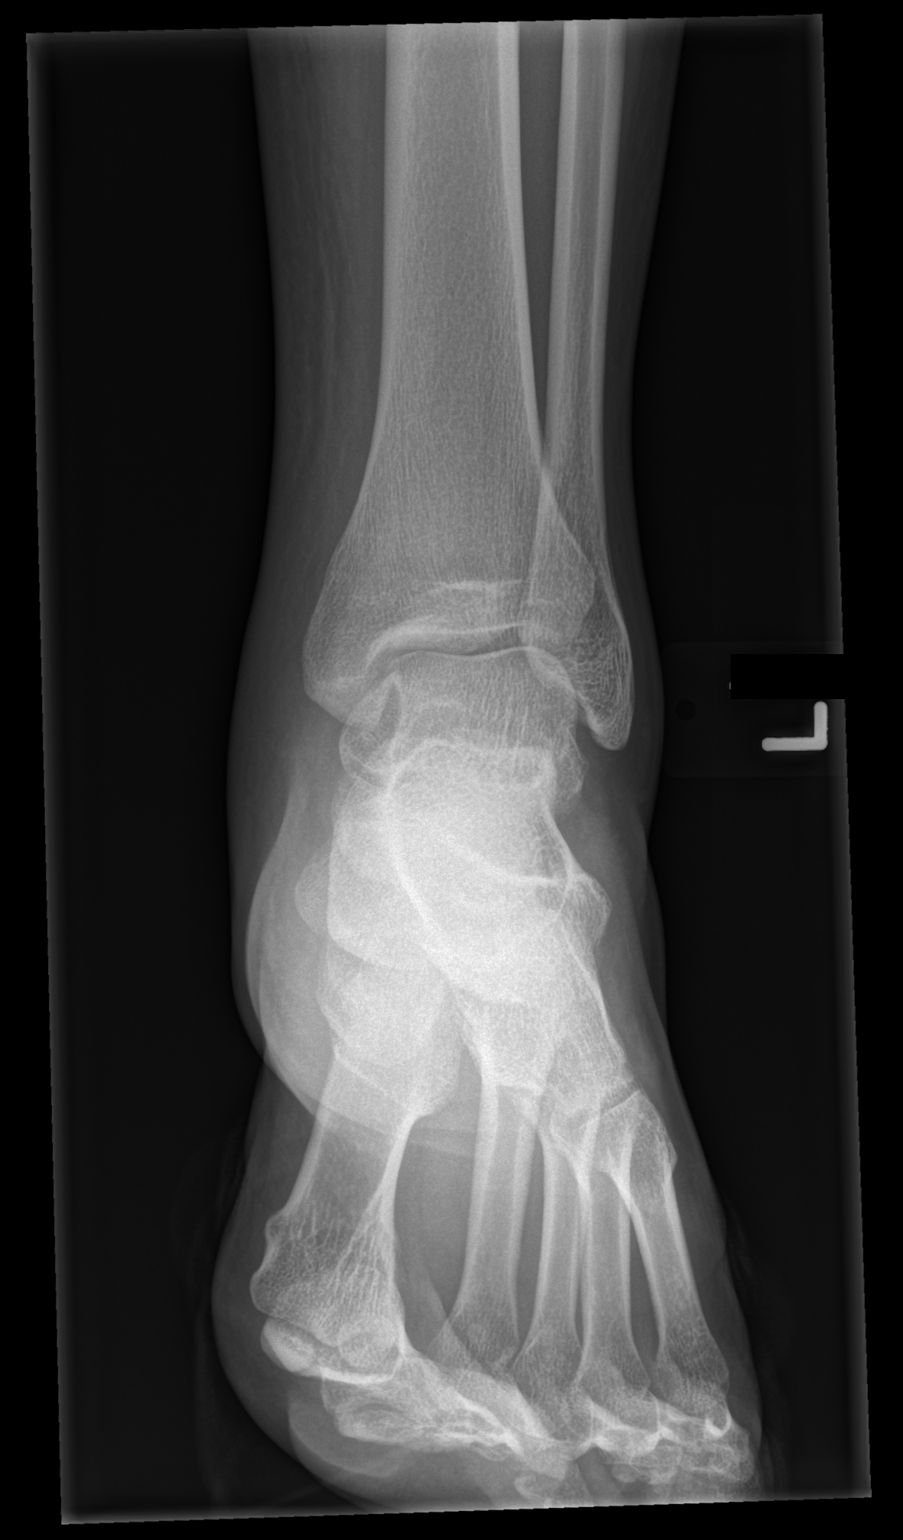

[x ankle obl left]
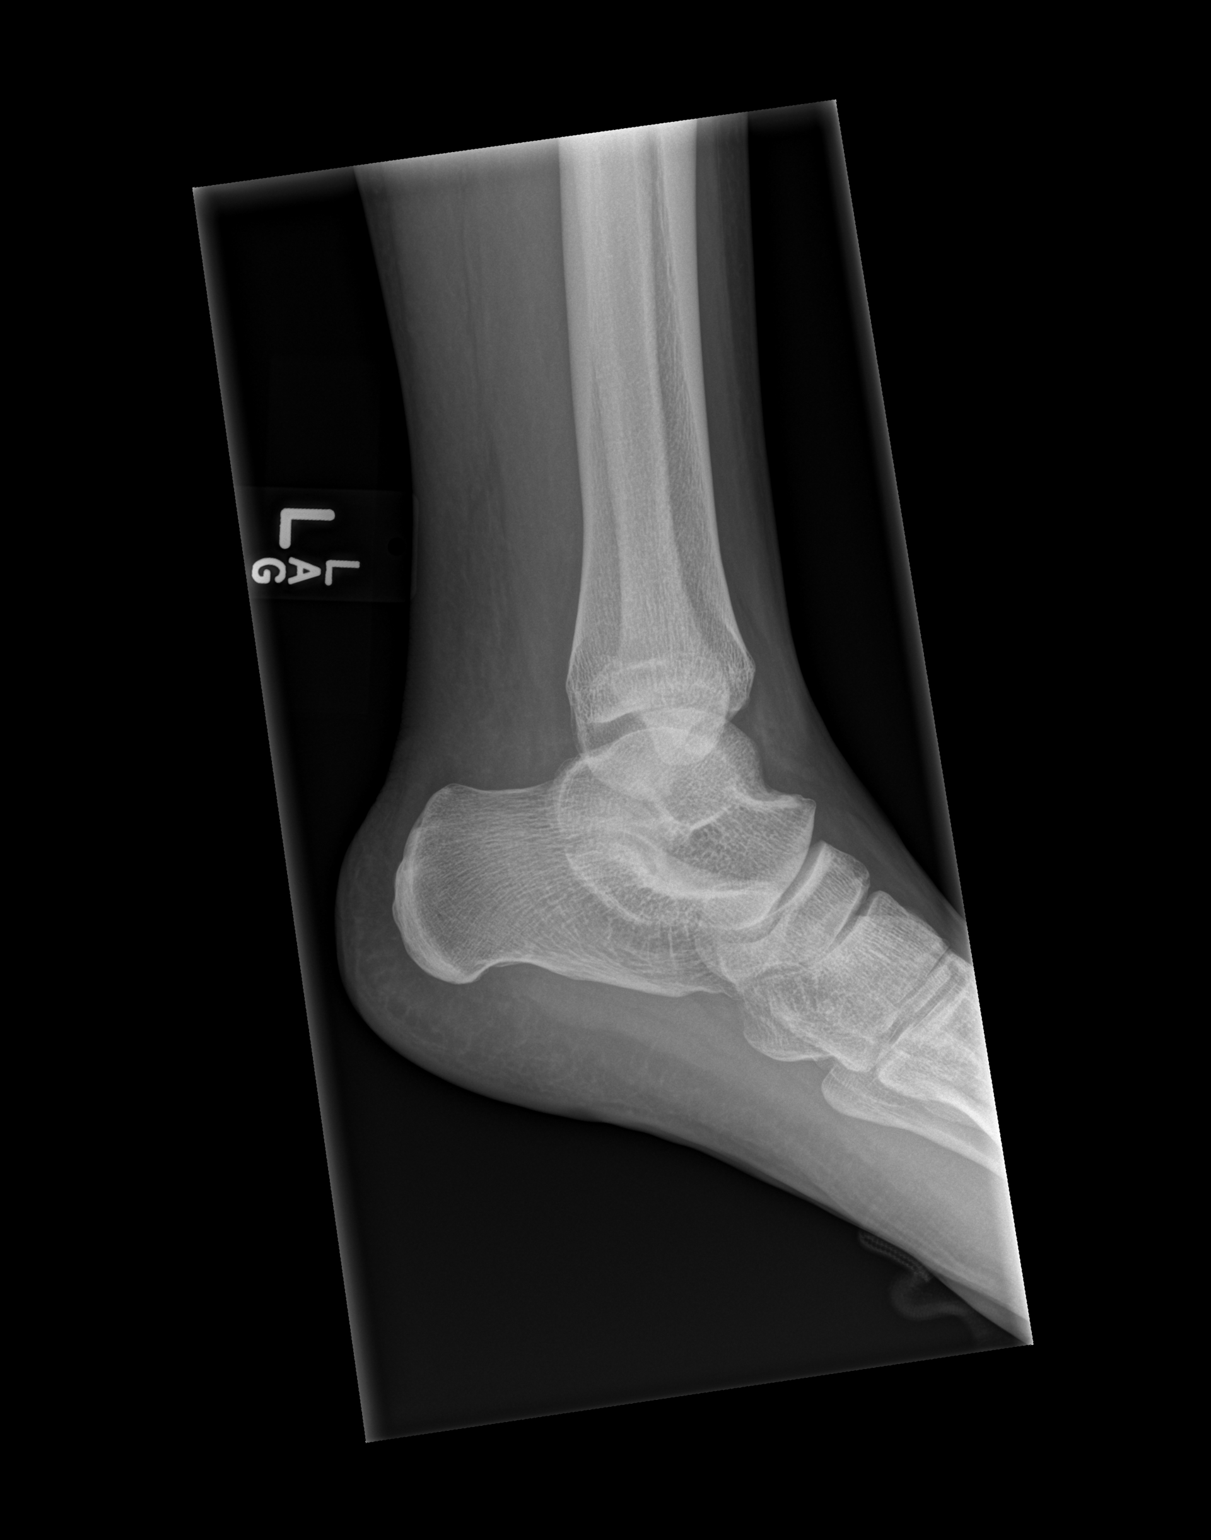

[2 of 2 positions shown; findings below may reference images not displayed]

FINDINGS: There is no evidence of fracture, dislocation, or joint effusion.
There is no evidence of arthropathy or other focal bone abnormality.
Medial soft tissue edema. No soft tissue air or radiopaque foreign
body.
IMPRESSION: Medial soft tissue edema. No osseous abnormality, soft tissue air or
radiopaque foreign body.

## 2017-12-04 DIAGNOSIS — E7800 Pure hypercholesterolemia, unspecified: Secondary | ICD-10-CM | POA: Diagnosis not present

## 2017-12-04 DIAGNOSIS — K219 Gastro-esophageal reflux disease without esophagitis: Secondary | ICD-10-CM | POA: Diagnosis not present

## 2017-12-04 DIAGNOSIS — I10 Essential (primary) hypertension: Secondary | ICD-10-CM | POA: Diagnosis not present

## 2017-12-04 DIAGNOSIS — E876 Hypokalemia: Secondary | ICD-10-CM | POA: Diagnosis not present

## 2017-12-06 DIAGNOSIS — K219 Gastro-esophageal reflux disease without esophagitis: Secondary | ICD-10-CM | POA: Diagnosis not present

## 2017-12-06 DIAGNOSIS — I10 Essential (primary) hypertension: Secondary | ICD-10-CM | POA: Diagnosis not present

## 2017-12-06 DIAGNOSIS — M25512 Pain in left shoulder: Secondary | ICD-10-CM | POA: Diagnosis not present

## 2017-12-06 DIAGNOSIS — E7800 Pure hypercholesterolemia, unspecified: Secondary | ICD-10-CM | POA: Diagnosis not present

## 2018-05-29 DIAGNOSIS — E7801 Familial hypercholesterolemia: Secondary | ICD-10-CM | POA: Diagnosis not present

## 2018-05-29 DIAGNOSIS — K219 Gastro-esophageal reflux disease without esophagitis: Secondary | ICD-10-CM | POA: Diagnosis not present

## 2018-05-29 DIAGNOSIS — I10 Essential (primary) hypertension: Secondary | ICD-10-CM | POA: Diagnosis not present

## 2018-05-29 DIAGNOSIS — E876 Hypokalemia: Secondary | ICD-10-CM | POA: Diagnosis not present

## 2019-12-27 ENCOUNTER — Ambulatory Visit: Payer: Self-pay | Attending: Internal Medicine

## 2019-12-27 DIAGNOSIS — Z23 Encounter for immunization: Secondary | ICD-10-CM

## 2019-12-27 NOTE — Progress Notes (Signed)
   Covid-19 Vaccination Clinic  Name:  SYLVIO WEATHERALL    MRN: [id number] DOB: 08-Mar-1976  12/27/2019  Mr. Spong was observed post Covid-19 immunization for 15 minutes without incident. He was provided with Vaccine Information Sheet and instruction to access the V-Safe system.   Mr. Markell was instructed to call 911 with any severe reactions post vaccine: Marland Kitchen Difficulty breathing  . Swelling of face and throat  . A fast heartbeat  . A bad rash all over body  . Dizziness and weakness   Immunizations Administered    Name Date Dose VIS Date Route   Moderna COVID-19 Vaccine 12/27/2019 11:06 AM 0.5 mL 09/16/2019 Intramuscular   Manufacturer: Moderna   Lot: [id number]   NDC: 15400-867-61

## 2022-07-11 DIAGNOSIS — R319 Hematuria, unspecified: Secondary | ICD-10-CM | POA: Diagnosis not present

## 2022-07-11 DIAGNOSIS — M1710 Unilateral primary osteoarthritis, unspecified knee: Secondary | ICD-10-CM | POA: Diagnosis not present

## 2022-07-11 DIAGNOSIS — M25561 Pain in right knee: Secondary | ICD-10-CM | POA: Diagnosis not present

## 2022-07-11 DIAGNOSIS — M19041 Primary osteoarthritis, right hand: Secondary | ICD-10-CM | POA: Diagnosis not present

## 2022-07-11 DIAGNOSIS — Z6825 Body mass index (BMI) 25.0-25.9, adult: Secondary | ICD-10-CM | POA: Diagnosis not present

## 2022-07-11 DIAGNOSIS — Z23 Encounter for immunization: Secondary | ICD-10-CM | POA: Diagnosis not present

## 2022-07-11 DIAGNOSIS — I10 Essential (primary) hypertension: Secondary | ICD-10-CM | POA: Diagnosis not present

## 2022-07-11 DIAGNOSIS — M19042 Primary osteoarthritis, left hand: Secondary | ICD-10-CM | POA: Diagnosis not present

## 2022-08-28 DIAGNOSIS — R030 Elevated blood-pressure reading, without diagnosis of hypertension: Secondary | ICD-10-CM | POA: Diagnosis not present

## 2022-08-28 DIAGNOSIS — Z6825 Body mass index (BMI) 25.0-25.9, adult: Secondary | ICD-10-CM | POA: Diagnosis not present

## 2022-08-28 DIAGNOSIS — M1711 Unilateral primary osteoarthritis, right knee: Secondary | ICD-10-CM | POA: Diagnosis not present

## 2022-09-21 DIAGNOSIS — M25561 Pain in right knee: Secondary | ICD-10-CM | POA: Diagnosis not present

## 2022-10-12 ENCOUNTER — Ambulatory Visit
Admission: EM | Admit: 2022-10-12 | Discharge: 2022-10-12 | Disposition: A | Discharge status: Home / Self Care | Payer: 59 | Attending: Family Medicine | Admitting: Family Medicine

## 2022-10-12 ENCOUNTER — Other Ambulatory Visit: Payer: Self-pay

## 2022-10-12 ENCOUNTER — Encounter: Payer: Self-pay | Admitting: Emergency Medicine

## 2022-10-12 DIAGNOSIS — R059 Cough, unspecified: Secondary | ICD-10-CM | POA: Diagnosis not present

## 2022-10-12 DIAGNOSIS — Z1152 Encounter for screening for COVID-19: Secondary | ICD-10-CM | POA: Insufficient documentation

## 2022-10-12 DIAGNOSIS — J069 Acute upper respiratory infection, unspecified: Secondary | ICD-10-CM

## 2022-10-12 DIAGNOSIS — J111 Influenza due to unidentified influenza virus with other respiratory manifestations: Secondary | ICD-10-CM | POA: Diagnosis not present

## 2022-10-12 MED ORDER — BENZONATATE 100 MG PO CAPS: 100.0000 mg | ORAL_CAPSULE | Freq: Three times a day (TID) | ORAL | 0 refills | Status: AC | PRN

## 2022-10-12 MED ORDER — OSELTAMIVIR PHOSPHATE 75 MG PO CAPS: 75.0000 mg | ORAL_CAPSULE | Freq: Two times a day (BID) | ORAL | 0 refills | Status: AC

## 2022-10-12 NOTE — Discharge Instructions (Signed)
Take benzonatate 100 mg, 1 tab every 8 hours as needed for cough.  Take oseltamivir 75 mg--1 capsule 2 times daily for 5 days   You have been swabbed for COVID, and the test will result in the next 24 hours. Our staff will call you if positive. If the COVID test is positive, you should quarantine for 5 days from the start of your symptoms

## 2022-10-12 NOTE — ED Triage Notes (Signed)
Pt here for body aches and fever x 2 days  

## 2022-10-12 NOTE — ED Provider Notes (Signed)
[facility]    CSN: [id number] Arrival date & time: 10/12/22  0840      History   Chief Complaint Chief Complaint  Patient presents with   Generalized Body Aches    HPI Francisco Crawford is a 46 y.o. male.   HPI Here for nasal drainage and congestion, postnasal drainage, and cough.  He has had fever to 100.6 at home.  He is also had some chills and myalgia and malaise.  No nausea or vomiting or diarrhea.  He does have a history of hypertension.    Past Medical History:  Diagnosis Date   Hypertension     Patient Active Problem List   Diagnosis Date Noted   Chest pain 06/10/2014   Hypertension     Past Surgical History:  Procedure Laterality Date   KNEE ARTHROSCOPY Right X 2       Home Medications    Prior to Admission medications   Medication Sig Start Date End Date Taking? Authorizing Provider  benzonatate (TESSALON) 100 MG capsule Take 1 capsule (100 mg total) by mouth 3 (three) times daily as needed for cough. 10/12/22  Yes Barrett Henle, MD  oseltamivir (TAMIFLU) 75 MG capsule Take 1 capsule (75 mg total) by mouth every 12 (twelve) hours. 10/12/22  Yes Barrett Henle, MD  amLODipine (NORVASC) 10 MG tablet Take 10 mg by mouth daily.     [provider]  hydrochlorothiazide (MICROZIDE) 12.5 MG capsule Take 12.5 mg by mouth daily.    [provider]  ibuprofen (ADVIL,MOTRIN) 200 MG tablet Take 600 mg by mouth every 6 (six) hours as needed for headache, mild pain or moderate pain.     [provider]    Family History Family History  Problem Relation Age of Onset   Hypertension Mother    CVA Father     Social History Social History   Tobacco Use   Smoking status: Never   Smokeless tobacco: Never  Vaping Use   Vaping Use: Never used  Substance Use Topics   Alcohol use: No    Alcohol/week: 0.0 standard drinks of alcohol   Drug use: No     Allergies   Patient has no known allergies.   Review  of Systems Review of Systems   Physical Exam Triage Vital Signs ED Triage Vitals [10/12/22 0949]  Enc Vitals Group     BP 134/86     Pulse Rate 87     Resp 18     Temp 98.5 F (36.9 C)     Temp Source Oral     SpO2 95 %     Weight      Height      Head Circumference      Peak Flow      Pain Score 4     Pain Loc      Pain Edu?      Excl. in [place]?    No data found.  Updated Vital Signs BP 134/86 (BP Location: Left Arm)   Pulse 87   Temp 98.5 F (36.9 C) (Oral)   Resp 18   SpO2 95%   Visual Acuity Right Eye Distance:   Left Eye Distance:   Bilateral Distance:    Right Eye Near:   Left Eye Near:    Bilateral Near:     Physical Exam Vitals reviewed.  Constitutional:      General: He is not in acute distress.    Appearance: He is  not toxic-appearing.  HENT:     Right Ear: Tympanic membrane and ear canal normal.     Left Ear: Tympanic membrane and ear canal normal.     Nose: Congestion present.     Mouth/Throat:     Mouth: Mucous membranes are moist.     Comments: There is clear mucus draining, and there is erythema of the posterior oropharynx and the tonsillar pillars.  There is no tonsillar hypertrophy. Eyes:     Extraocular Movements: Extraocular movements intact.     Conjunctiva/sclera: Conjunctivae normal.     Pupils: Pupils are equal, round, and reactive to light.  Cardiovascular:     Rate and Rhythm: Normal rate and regular rhythm.     Heart sounds: No murmur heard. Pulmonary:     Effort: No respiratory distress.     Breath sounds: No stridor. No wheezing, rhonchi or rales.  Musculoskeletal:     Cervical back: Neck supple.  Lymphadenopathy:     Cervical: No cervical adenopathy.  Skin:    Capillary Refill: Capillary refill takes less than 2 seconds.     Coloration: Skin is not jaundiced or pale.  Neurological:     General: No focal deficit present.     Mental Status: He is alert and oriented to person, place, and time.  Psychiatric:         Behavior: Behavior normal.      UC Treatments / Results  Labs (all labs ordered are listed, but only abnormal results are displayed) Labs Reviewed  SARS CORONAVIRUS 2 (TAT 6-24 HRS)    EKG   Radiology No results found.  Procedures Procedures (including critical care time)  Medications Ordered in UC Medications - No data to display  Initial Impression / Assessment and Plan / UC Course  I have reviewed the triage vital signs and the nursing notes.  Pertinent labs & imaging results that were available during my care of the patient were reviewed by me and considered in my medical decision making (see chart for details).        Due to a shortage of PCR flu test, I am going to treat empirically for the flu.  He is swabbed for COVID, and if he is positive he is a candidate for Paxlovid, as his last EGFR was greater than 60. Final Clinical Impressions(s) / UC Diagnoses   Final diagnoses:  Viral URI with cough  Influenza-like illness     Discharge Instructions      Take benzonatate 100 mg, 1 tab every 8 hours as needed for cough.  Take oseltamivir 75 mg--1 capsule 2 times daily for 5 days   You have been swabbed for COVID, and the test will result in the next 24 hours. Our staff will call you if positive. If the COVID test is positive, you should quarantine for 5 days from the start of your symptoms       ED Prescriptions     Medication Sig Dispense Auth. Provider   benzonatate (TESSALON) 100 MG capsule Take 1 capsule (100 mg total) by mouth 3 (three) times daily as needed for cough. 21 capsule Barrett Henle, MD   oseltamivir (TAMIFLU) 75 MG capsule Take 1 capsule (75 mg total) by mouth every 12 (twelve) hours. 10 capsule Barrett Henle, MD      PDMP not reviewed this encounter.   Barrett Henle, MD 10/12/22 1016

## 2022-10-13 LAB — SARS CORONAVIRUS 2 (TAT 6-24 HRS): SARS Coronavirus 2: NEGATIVE

## 2022-11-21 DIAGNOSIS — M25561 Pain in right knee: Secondary | ICD-10-CM | POA: Diagnosis not present

## 2022-11-28 DIAGNOSIS — S83241D Other tear of medial meniscus, current injury, right knee, subsequent encounter: Secondary | ICD-10-CM | POA: Diagnosis not present

## 2023-02-22 DIAGNOSIS — J069 Acute upper respiratory infection, unspecified: Secondary | ICD-10-CM | POA: Diagnosis not present

## 2023-02-22 DIAGNOSIS — Z20828 Contact with and (suspected) exposure to other viral communicable diseases: Secondary | ICD-10-CM | POA: Diagnosis not present

## 2023-02-22 DIAGNOSIS — R030 Elevated blood-pressure reading, without diagnosis of hypertension: Secondary | ICD-10-CM | POA: Diagnosis not present

## 2023-02-22 DIAGNOSIS — Z6827 Body mass index (BMI) 27.0-27.9, adult: Secondary | ICD-10-CM | POA: Diagnosis not present

## 2023-06-27 DIAGNOSIS — M25561 Pain in right knee: Secondary | ICD-10-CM | POA: Diagnosis not present

## 2023-06-27 DIAGNOSIS — Z23 Encounter for immunization: Secondary | ICD-10-CM | POA: Diagnosis not present

## 2023-06-27 DIAGNOSIS — M19041 Primary osteoarthritis, right hand: Secondary | ICD-10-CM | POA: Diagnosis not present

## 2023-06-27 DIAGNOSIS — M1710 Unilateral primary osteoarthritis, unspecified knee: Secondary | ICD-10-CM | POA: Diagnosis not present

## 2023-06-27 DIAGNOSIS — Z6826 Body mass index (BMI) 26.0-26.9, adult: Secondary | ICD-10-CM | POA: Diagnosis not present

## 2023-06-27 DIAGNOSIS — M19042 Primary osteoarthritis, left hand: Secondary | ICD-10-CM | POA: Diagnosis not present

## 2023-06-27 DIAGNOSIS — R319 Hematuria, unspecified: Secondary | ICD-10-CM | POA: Diagnosis not present

## 2023-06-27 DIAGNOSIS — I10 Essential (primary) hypertension: Secondary | ICD-10-CM | POA: Diagnosis not present

## 2023-09-15 DIAGNOSIS — R140 Abdominal distension (gaseous): Secondary | ICD-10-CM | POA: Diagnosis not present

## 2023-09-15 DIAGNOSIS — K5903 Drug induced constipation: Secondary | ICD-10-CM | POA: Diagnosis not present

## 2023-09-15 DIAGNOSIS — M549 Dorsalgia, unspecified: Secondary | ICD-10-CM | POA: Diagnosis not present

## 2023-09-15 DIAGNOSIS — E876 Hypokalemia: Secondary | ICD-10-CM | POA: Diagnosis not present

## 2023-09-15 DIAGNOSIS — R1030 Lower abdominal pain, unspecified: Secondary | ICD-10-CM | POA: Diagnosis not present

## 2023-09-15 DIAGNOSIS — I10 Essential (primary) hypertension: Secondary | ICD-10-CM | POA: Diagnosis not present

## 2023-09-15 DIAGNOSIS — K7689 Other specified diseases of liver: Secondary | ICD-10-CM | POA: Diagnosis not present
